# Patient Record
Sex: Male | Born: 1990 | Hispanic: Yes | Marital: Single | Smoking: Never smoker
Health system: Southern US, Community
[De-identification: ages and names within clinical notes are randomized; demographics above are authoritative.]

---

## 2016-03-04 ENCOUNTER — Inpatient Hospital Stay (HOSPITAL_COMMUNITY): Payer: Self-pay

## 2016-03-04 ENCOUNTER — Encounter (HOSPITAL_COMMUNITY)
Admission: EM | Disposition: A | Discharge status: Home Health | Payer: Self-pay | Source: Home / Self Care | Attending: Orthopedic Surgery

## 2016-03-04 ENCOUNTER — Emergency Department (HOSPITAL_COMMUNITY): Payer: Self-pay

## 2016-03-04 ENCOUNTER — Inpatient Hospital Stay (HOSPITAL_COMMUNITY): Payer: Self-pay | Admitting: Anesthesiology

## 2016-03-04 ENCOUNTER — Inpatient Hospital Stay (HOSPITAL_COMMUNITY)
Admission: EM | Admit: 2016-03-04 | Discharge: 2016-03-06 | DRG: 494 | Disposition: A | Discharge status: Home Health | Payer: Self-pay | Attending: Orthopedic Surgery | Admitting: Orthopedic Surgery

## 2016-03-04 ENCOUNTER — Encounter (HOSPITAL_COMMUNITY): Payer: Self-pay | Admitting: Emergency Medicine

## 2016-03-04 DIAGNOSIS — Z419 Encounter for procedure for purposes other than remedying health state, unspecified: Secondary | ICD-10-CM

## 2016-03-04 DIAGNOSIS — T1490XA Injury, unspecified, initial encounter: Secondary | ICD-10-CM

## 2016-03-04 DIAGNOSIS — D72829 Elevated white blood cell count, unspecified: Secondary | ICD-10-CM | POA: Diagnosis present

## 2016-03-04 DIAGNOSIS — S82209A Unspecified fracture of shaft of unspecified tibia, initial encounter for closed fracture: Secondary | ICD-10-CM | POA: Diagnosis present

## 2016-03-04 DIAGNOSIS — S82202A Unspecified fracture of shaft of left tibia, initial encounter for closed fracture: Principal | ICD-10-CM | POA: Diagnosis present

## 2016-03-04 HISTORY — PX: TIBIA IM NAIL INSERTION: SHX2516

## 2016-03-04 LAB — COMPREHENSIVE METABOLIC PANEL
ALK PHOS: 94 U/L (ref 38–126)
ALT: 43 U/L (ref 17–63)
AST: 32 U/L (ref 15–41)
Albumin: 3.8 g/dL (ref 3.5–5.0)
Anion gap: 8 (ref 5–15)
BUN: 7 mg/dL (ref 6–20)
CALCIUM: 8.9 mg/dL (ref 8.9–10.3)
CO2: 25 mmol/L (ref 22–32)
CREATININE: 0.86 mg/dL (ref 0.61–1.24)
Chloride: 107 mmol/L (ref 101–111)
Glucose, Bld: 108 mg/dL — ABNORMAL HIGH (ref 65–99)
Potassium: 4.2 mmol/L (ref 3.5–5.1)
Sodium: 140 mmol/L (ref 135–145)
Total Bilirubin: 0.6 mg/dL (ref 0.3–1.2)
Total Protein: 7.3 g/dL (ref 6.5–8.1)

## 2016-03-04 LAB — CBC
HCT: 47.8 % (ref 39.0–52.0)
Hemoglobin: 16.7 g/dL (ref 13.0–17.0)
MCH: 31 pg (ref 26.0–34.0)
MCHC: 34.9 g/dL (ref 30.0–36.0)
MCV: 88.7 fL (ref 78.0–100.0)
PLATELETS: 276 10*3/uL (ref 150–400)
RBC: 5.39 MIL/uL (ref 4.22–5.81)
RDW: 12.8 % (ref 11.5–15.5)
WBC: 16.2 10*3/uL — ABNORMAL HIGH (ref 4.0–10.5)

## 2016-03-04 LAB — SURGICAL PCR SCREEN
MRSA, PCR: NEGATIVE
Staphylococcus aureus: POSITIVE — AB

## 2016-03-04 LAB — PROTIME-INR
INR: 0.98 (ref 0.00–1.49)
Prothrombin Time: 13.2 seconds (ref 11.6–15.2)

## 2016-03-04 LAB — ETHANOL: ALCOHOL ETHYL (B): 7 mg/dL — AB (ref <5)

## 2016-03-04 SURGERY — INSERTION, INTRAMEDULLARY ROD, TIBIA
Anesthesia: Regional | Site: Leg Lower | Laterality: Left

## 2016-03-04 MED ORDER — FENTANYL CITRATE (PF) 250 MCG/5ML IJ SOLN
INTRAMUSCULAR | Status: AC
  Filled 2016-03-04: qty 5

## 2016-03-04 MED ORDER — MORPHINE SULFATE (PF) 2 MG/ML IV SOLN: 2.0000 mg | INTRAVENOUS | Status: DC | PRN

## 2016-03-04 MED ORDER — DEXTROSE-NACL 5-0.45 % IV SOLN: 100.0000 mL/h | INTRAVENOUS | Status: DC

## 2016-03-04 MED ORDER — ACETAMINOPHEN 650 MG RE SUPP: 650.0000 mg | Freq: Four times a day (QID) | RECTAL | Status: DC | PRN

## 2016-03-04 MED ORDER — LACTATED RINGERS IV SOLN
INTRAVENOUS | Status: DC | PRN
  Administered 2016-03-04 (×3): via INTRAVENOUS

## 2016-03-04 MED ORDER — ASPIRIN EC 325 MG PO TBEC: 325.0000 mg | DELAYED_RELEASE_TABLET | Freq: Every day | ORAL | Status: AC

## 2016-03-04 MED ORDER — CHLORHEXIDINE GLUCONATE 4 % EX LIQD: 60.0000 mL | Freq: Once | CUTANEOUS | Status: DC

## 2016-03-04 MED ORDER — FENTANYL CITRATE (PF) 100 MCG/2ML IJ SOLN
INTRAMUSCULAR | Status: DC | PRN
  Administered 2016-03-04 (×2): 50 ug via INTRAVENOUS
  Administered 2016-03-04 (×2): 25 ug via INTRAVENOUS
  Administered 2016-03-04 (×2): 50 ug via INTRAVENOUS

## 2016-03-04 MED ORDER — METHOCARBAMOL 500 MG PO TABS
500.0000 mg | ORAL_TABLET | Freq: Four times a day (QID) | ORAL | Status: DC | PRN
  Administered 2016-03-04 – 2016-03-06 (×3): 500 mg via ORAL
  Filled 2016-03-04 (×3): qty 1

## 2016-03-04 MED ORDER — METOCLOPRAMIDE HCL 5 MG PO TABS: 5.0000 mg | ORAL_TABLET | Freq: Three times a day (TID) | ORAL | Status: DC | PRN

## 2016-03-04 MED ORDER — ACETAMINOPHEN 500 MG PO TABS
1000.0000 mg | ORAL_TABLET | Freq: Once | ORAL | Status: AC
  Administered 2016-03-04: 1000 mg via ORAL

## 2016-03-04 MED ORDER — OXYCODONE-ACETAMINOPHEN 5-325 MG PO TABS: 1.0000 | ORAL_TABLET | ORAL | Status: AC | PRN

## 2016-03-04 MED ORDER — METHOCARBAMOL 1000 MG/10ML IJ SOLN
500.0000 mg | Freq: Four times a day (QID) | INTRAVENOUS | Status: DC | PRN
  Filled 2016-03-04: qty 5

## 2016-03-04 MED ORDER — MIDAZOLAM HCL 2 MG/2ML IJ SOLN
INTRAMUSCULAR | Status: AC
  Filled 2016-03-04: qty 2

## 2016-03-04 MED ORDER — ACETAMINOPHEN 325 MG PO TABS: 650.0000 mg | ORAL_TABLET | Freq: Four times a day (QID) | ORAL | Status: DC | PRN

## 2016-03-04 MED ORDER — DOCUSATE SODIUM 100 MG PO CAPS
100.0000 mg | ORAL_CAPSULE | Freq: Two times a day (BID) | ORAL | Status: DC
  Administered 2016-03-04 – 2016-03-06 (×4): 100 mg via ORAL
  Filled 2016-03-04 (×4): qty 1

## 2016-03-04 MED ORDER — KETOROLAC TROMETHAMINE 15 MG/ML IJ SOLN
15.0000 mg | Freq: Four times a day (QID) | INTRAMUSCULAR | Status: AC
  Administered 2016-03-04 – 2016-03-05 (×4): 15 mg via INTRAVENOUS
  Filled 2016-03-04 (×4): qty 1

## 2016-03-04 MED ORDER — ACETAMINOPHEN 160 MG/5ML PO SOLN: 325.0000 mg | ORAL | Status: DC | PRN

## 2016-03-04 MED ORDER — ONDANSETRON HCL 4 MG/2ML IJ SOLN
4.0000 mg | Freq: Once | INTRAMUSCULAR | Status: AC
  Administered 2016-03-04: 4 mg via INTRAVENOUS
  Filled 2016-03-04: qty 2

## 2016-03-04 MED ORDER — POVIDONE-IODINE 10 % EX SWAB: 2.0000 "application " | Freq: Once | CUTANEOUS | Status: DC

## 2016-03-04 MED ORDER — ACETAMINOPHEN 325 MG PO TABS
650.0000 mg | ORAL_TABLET | Freq: Four times a day (QID) | ORAL | Status: DC | PRN
  Filled 2016-03-04: qty 2

## 2016-03-04 MED ORDER — LACTATED RINGERS IV SOLN
INTRAVENOUS | Status: DC
  Administered 2016-03-04: 21:00:00 via INTRAVENOUS

## 2016-03-04 MED ORDER — PHENYLEPHRINE HCL 10 MG/ML IJ SOLN
10.0000 mg | INTRAVENOUS | Status: DC | PRN
  Administered 2016-03-04: 10 ug/min via INTRAVENOUS

## 2016-03-04 MED ORDER — ASPIRIN 325 MG PO TABS
325.0000 mg | ORAL_TABLET | Freq: Every day | ORAL | Status: DC
  Administered 2016-03-04 – 2016-03-06 (×3): 325 mg via ORAL
  Filled 2016-03-04 (×3): qty 1

## 2016-03-04 MED ORDER — DEXAMETHASONE SODIUM PHOSPHATE 10 MG/ML IJ SOLN
INTRAMUSCULAR | Status: DC | PRN
  Administered 2016-03-04: 10 mg via INTRAVENOUS

## 2016-03-04 MED ORDER — PHENYLEPHRINE 40 MCG/ML (10ML) SYRINGE FOR IV PUSH (FOR BLOOD PRESSURE SUPPORT)
PREFILLED_SYRINGE | INTRAVENOUS | Status: AC
  Filled 2016-03-04: qty 10

## 2016-03-04 MED ORDER — IOPAMIDOL (ISOVUE-300) INJECTION 61%
INTRAVENOUS | Status: AC
  Administered 2016-03-04: 100 mL
  Filled 2016-03-04: qty 100

## 2016-03-04 MED ORDER — OXYCODONE HCL 5 MG PO TABS: 5.0000 mg | ORAL_TABLET | ORAL | Status: DC | PRN

## 2016-03-04 MED ORDER — HYDROMORPHONE HCL 1 MG/ML IJ SOLN
INTRAMUSCULAR | Status: AC
  Administered 2016-03-04: 0.5 mg via INTRAVENOUS
  Filled 2016-03-04: qty 1

## 2016-03-04 MED ORDER — ACETAMINOPHEN 500 MG PO TABS
ORAL_TABLET | ORAL | Status: AC
  Filled 2016-03-04: qty 1

## 2016-03-04 MED ORDER — OMEPRAZOLE 20 MG PO CPDR: 20.0000 mg | DELAYED_RELEASE_CAPSULE | Freq: Every day | ORAL | Status: AC

## 2016-03-04 MED ORDER — DEXTROSE 5 % IV SOLN
3.0000 g | INTRAVENOUS | Status: AC
  Administered 2016-03-04: 3 g via INTRAVENOUS
  Filled 2016-03-04: qty 3000

## 2016-03-04 MED ORDER — OXYCODONE HCL 5 MG/5ML PO SOLN: 5.0000 mg | Freq: Once | ORAL | Status: DC | PRN

## 2016-03-04 MED ORDER — PROPOFOL 10 MG/ML IV BOLUS
INTRAVENOUS | Status: DC | PRN
  Administered 2016-03-04: 200 mg via INTRAVENOUS

## 2016-03-04 MED ORDER — ONDANSETRON HCL 4 MG/2ML IJ SOLN
INTRAMUSCULAR | Status: AC
  Filled 2016-03-04: qty 6

## 2016-03-04 MED ORDER — CEFAZOLIN IN D5W 1 GM/50ML IV SOLN
1.0000 g | Freq: Four times a day (QID) | INTRAVENOUS | Status: AC
  Administered 2016-03-04 – 2016-03-05 (×3): 1 g via INTRAVENOUS
  Filled 2016-03-04 (×4): qty 50

## 2016-03-04 MED ORDER — BUPIVACAINE-EPINEPHRINE (PF) 0.5% -1:200000 IJ SOLN
INTRAMUSCULAR | Status: DC | PRN
  Administered 2016-03-04: 30 mL via PERINEURAL

## 2016-03-04 MED ORDER — HYDROMORPHONE HCL 1 MG/ML IJ SOLN
1.0000 mg | Freq: Once | INTRAMUSCULAR | Status: AC
  Administered 2016-03-04: 1 mg via INTRAVENOUS
  Filled 2016-03-04: qty 1

## 2016-03-04 MED ORDER — ROCURONIUM BROMIDE 50 MG/5ML IV SOLN
INTRAVENOUS | Status: AC
  Filled 2016-03-04: qty 2

## 2016-03-04 MED ORDER — METOCLOPRAMIDE HCL 5 MG/ML IJ SOLN: 5.0000 mg | Freq: Three times a day (TID) | INTRAMUSCULAR | Status: DC | PRN

## 2016-03-04 MED ORDER — ONDANSETRON HCL 4 MG PO TABS: 4.0000 mg | ORAL_TABLET | Freq: Every day | ORAL | Status: AC | PRN

## 2016-03-04 MED ORDER — ONDANSETRON HCL 4 MG/2ML IJ SOLN: 4.0000 mg | Freq: Four times a day (QID) | INTRAMUSCULAR | Status: DC | PRN

## 2016-03-04 MED ORDER — ONDANSETRON HCL 4 MG/2ML IJ SOLN
INTRAMUSCULAR | Status: DC | PRN
  Administered 2016-03-04: 4 mg via INTRAVENOUS

## 2016-03-04 MED ORDER — HYDROMORPHONE HCL 1 MG/ML IJ SOLN
0.2500 mg | INTRAMUSCULAR | Status: DC | PRN
Start: 2016-03-04 — End: 2016-03-04
  Administered 2016-03-04: 0.5 mg via INTRAVENOUS

## 2016-03-04 MED ORDER — ACETAMINOPHEN 325 MG PO TABS: 325.0000 mg | ORAL_TABLET | ORAL | Status: DC | PRN

## 2016-03-04 MED ORDER — PHENYLEPHRINE HCL 10 MG/ML IJ SOLN
INTRAMUSCULAR | Status: DC | PRN
  Administered 2016-03-04 (×6): 80 ug via INTRAVENOUS

## 2016-03-04 MED ORDER — CELECOXIB 200 MG PO CAPS
200.0000 mg | ORAL_CAPSULE | Freq: Two times a day (BID) | ORAL | Status: DC
  Administered 2016-03-04 – 2016-03-06 (×4): 200 mg via ORAL
  Filled 2016-03-04 (×5): qty 1

## 2016-03-04 MED ORDER — FENTANYL CITRATE (PF) 100 MCG/2ML IJ SOLN
100.0000 ug | Freq: Once | INTRAMUSCULAR | Status: AC
  Administered 2016-03-04: 100 ug via INTRAVENOUS
  Filled 2016-03-04: qty 2

## 2016-03-04 MED ORDER — LACTATED RINGERS IV SOLN
INTRAVENOUS | Status: DC
  Administered 2016-03-04: 14:00:00 via INTRAVENOUS

## 2016-03-04 MED ORDER — DIPHENHYDRAMINE HCL 12.5 MG/5ML PO ELIX: 12.5000 mg | ORAL_SOLUTION | ORAL | Status: DC | PRN

## 2016-03-04 MED ORDER — 0.9 % SODIUM CHLORIDE (POUR BTL) OPTIME
TOPICAL | Status: DC | PRN
  Administered 2016-03-04: 1000 mL

## 2016-03-04 MED ORDER — DEXTROSE-NACL 5-0.45 % IV SOLN: INTRAVENOUS | Status: DC

## 2016-03-04 MED ORDER — ONDANSETRON HCL 4 MG PO TABS: 4.0000 mg | ORAL_TABLET | Freq: Four times a day (QID) | ORAL | Status: DC | PRN

## 2016-03-04 MED ORDER — SUCCINYLCHOLINE CHLORIDE 20 MG/ML IJ SOLN
INTRAMUSCULAR | Status: DC | PRN
  Administered 2016-03-04: 60 mg via INTRAVENOUS

## 2016-03-04 MED ORDER — OXYCODONE HCL 5 MG PO TABS
5.0000 mg | ORAL_TABLET | ORAL | Status: DC | PRN
  Administered 2016-03-05: 10 mg via ORAL
  Administered 2016-03-05 (×2): 5 mg via ORAL
  Administered 2016-03-06: 10 mg via ORAL
  Filled 2016-03-04 (×2): qty 2
  Filled 2016-03-04 (×2): qty 1

## 2016-03-04 MED ORDER — MIDAZOLAM HCL 5 MG/5ML IJ SOLN
INTRAMUSCULAR | Status: DC | PRN
  Administered 2016-03-04: 2 mg via INTRAVENOUS

## 2016-03-04 MED ORDER — PROPOFOL 10 MG/ML IV BOLUS
INTRAVENOUS | Status: AC
  Filled 2016-03-04: qty 20

## 2016-03-04 MED ORDER — DOCUSATE SODIUM 100 MG PO CAPS: 100.0000 mg | ORAL_CAPSULE | Freq: Two times a day (BID) | ORAL | Status: AC

## 2016-03-04 MED ORDER — OXYCODONE HCL 5 MG PO TABS: 5.0000 mg | ORAL_TABLET | Freq: Once | ORAL | Status: DC | PRN

## 2016-03-04 MED ORDER — FENTANYL CITRATE (PF) 100 MCG/2ML IJ SOLN
INTRAMUSCULAR | Status: DC
Start: 2016-03-04 — End: 2016-03-04
  Filled 2016-03-04: qty 2

## 2016-03-04 MED ORDER — ACETAMINOPHEN 650 MG RE SUPP
650.0000 mg | Freq: Four times a day (QID) | RECTAL | Status: DC | PRN
  Filled 2016-03-04: qty 1

## 2016-03-04 SURGICAL SUPPLY — 79 items
BANDAGE ACE 4X5 VEL STRL LF (GAUZE/BANDAGES/DRESSINGS) ×3 IMPLANT
BANDAGE ELASTIC 4 VELCRO ST LF (GAUZE/BANDAGES/DRESSINGS) ×3 IMPLANT
BANDAGE ELASTIC 6 VELCRO ST LF (GAUZE/BANDAGES/DRESSINGS) ×3 IMPLANT
BENZOIN TINCTURE PRP APPL 2/3 (GAUZE/BANDAGES/DRESSINGS) ×3 IMPLANT
BIT DRILL AO GAMMA 4.2X130 (BIT) ×3 IMPLANT
BIT DRILL AO GAMMA 4.2X340 (BIT) ×3 IMPLANT
BLADE SURG 10 STRL SS (BLADE) ×3 IMPLANT
BLADE SURG 15 STRL LF DISP TIS (BLADE) ×1 IMPLANT
BLADE SURG 15 STRL SS (BLADE) ×2
BNDG COHESIVE 4X5 TAN STRL (GAUZE/BANDAGES/DRESSINGS) ×3 IMPLANT
BNDG GAUZE ELAST 4 BULKY (GAUZE/BANDAGES/DRESSINGS) ×3 IMPLANT
CHLORAPREP W/TINT 26ML (MISCELLANEOUS) ×3 IMPLANT
CLOSURE STERI-STRIP 1/2X4 (GAUZE/BANDAGES/DRESSINGS) ×1
CLOSURE WOUND 1/2 X4 (GAUZE/BANDAGES/DRESSINGS) ×1
CLSR STERI-STRIP ANTIMIC 1/2X4 (GAUZE/BANDAGES/DRESSINGS) ×2 IMPLANT
COVER MAYO STAND STRL (DRAPES) ×3 IMPLANT
COVER SURGICAL LIGHT HANDLE (MISCELLANEOUS) ×6 IMPLANT
CUFF TOURNIQUET SINGLE 34IN LL (TOURNIQUET CUFF) IMPLANT
CUFF TOURNIQUET SINGLE 44IN (TOURNIQUET CUFF) IMPLANT
DRAPE C-ARM 42X72 X-RAY (DRAPES) ×3 IMPLANT
DRAPE C-ARMOR (DRAPES) IMPLANT
DRAPE IMP U-DRAPE 54X76 (DRAPES) ×3 IMPLANT
DRAPE PROXIMA HALF (DRAPES) ×3 IMPLANT
DRESSING OPSITE X SMALL 2X3 (GAUZE/BANDAGES/DRESSINGS) ×3 IMPLANT
DRSG ADAPTIC 3X8 NADH LF (GAUZE/BANDAGES/DRESSINGS) ×3 IMPLANT
DRSG PAD ABDOMINAL 8X10 ST (GAUZE/BANDAGES/DRESSINGS) ×3 IMPLANT
DRSG TEGADERM 4X4.75 (GAUZE/BANDAGES/DRESSINGS) ×6 IMPLANT
ELECT CAUTERY BLADE 6.4 (BLADE) ×3 IMPLANT
ELECT REM PT RETURN 9FT ADLT (ELECTROSURGICAL) ×3
ELECTRODE REM PT RTRN 9FT ADLT (ELECTROSURGICAL) ×1 IMPLANT
END CAP TIBIAL T2 11.5X10 (Cap) ×3 IMPLANT
GAUZE SPONGE 4X4 12PLY STRL (GAUZE/BANDAGES/DRESSINGS) ×3 IMPLANT
GLOVE BIO SURGEON STRL SZ7 (GLOVE) ×3 IMPLANT
GLOVE BIO SURGEON STRL SZ7.5 (GLOVE) ×6 IMPLANT
GLOVE BIO SURGEON STRL SZ8.5 (GLOVE) IMPLANT
GLOVE BIOGEL PI IND STRL 6.5 (GLOVE) ×1 IMPLANT
GLOVE BIOGEL PI IND STRL 7.0 (GLOVE) ×1 IMPLANT
GLOVE BIOGEL PI IND STRL 8 (GLOVE) ×2 IMPLANT
GLOVE BIOGEL PI INDICATOR 6.5 (GLOVE) ×2
GLOVE BIOGEL PI INDICATOR 7.0 (GLOVE) ×2
GLOVE BIOGEL PI INDICATOR 8 (GLOVE) ×4
GLOVE ECLIPSE 6.5 STRL STRAW (GLOVE) ×3 IMPLANT
GOWN STRL REUS W/ TWL LRG LVL3 (GOWN DISPOSABLE) ×2 IMPLANT
GOWN STRL REUS W/TWL LRG LVL3 (GOWN DISPOSABLE) ×4
GUIDEWIRE GAMMA (WIRE) ×3 IMPLANT
GUIDEWIRE GAMMA 800 (WIRE) ×3 IMPLANT
KIT BASIN OR (CUSTOM PROCEDURE TRAY) ×3 IMPLANT
KIT ROOM TURNOVER OR (KITS) ×3 IMPLANT
MANIFOLD NEPTUNE II (INSTRUMENTS) ×3 IMPLANT
NAIL TIBIAL STND 10X300MM (Nail) ×3 IMPLANT
NAIL TIBIALSTD 10X300MM (Nail) ×1 IMPLANT
NS IRRIG 1000ML POUR BTL (IV SOLUTION) ×3 IMPLANT
PACK ORTHO EXTREMITY (CUSTOM PROCEDURE TRAY) ×3 IMPLANT
PACK UNIVERSAL I (CUSTOM PROCEDURE TRAY) ×3 IMPLANT
PAD ARMBOARD 7.5X6 YLW CONV (MISCELLANEOUS) ×6 IMPLANT
PAD CAST 4YDX4 CTTN HI CHSV (CAST SUPPLIES) ×1 IMPLANT
PADDING CAST COTTON 4X4 STRL (CAST SUPPLIES) ×2
REAMER SHAFT BIXCUT (INSTRUMENTS) ×3 IMPLANT
SCREW LOCKING FULL THREAD 5X52 (Screw) ×3 IMPLANT
SCREW LOCKING T2 F/T  5MMX40MM (Screw) ×2 IMPLANT
SCREW LOCKING T2 F/T 5MMX40MM (Screw) ×1 IMPLANT
SCREW LOCKING THREADED 5X47.5 (Screw) ×3 IMPLANT
SPONGE LAP 18X18 X RAY DECT (DISPOSABLE) ×3 IMPLANT
STAPLER VISISTAT 35W (STAPLE) IMPLANT
STRIP CLOSURE SKIN 1/2X4 (GAUZE/BANDAGES/DRESSINGS) ×2 IMPLANT
SUT MNCRL AB 4-0 PS2 18 (SUTURE) IMPLANT
SUT MON AB 2-0 CT1 27 (SUTURE) ×3 IMPLANT
SUT MON AB 2-0 CT1 36 (SUTURE) ×3 IMPLANT
SUT MON AB 5-0 PS2 18 (SUTURE) ×3 IMPLANT
SUT VIC AB 0 CT1 27 (SUTURE) ×2
SUT VIC AB 0 CT1 27XBRD ANBCTR (SUTURE) ×1 IMPLANT
SYR BULB IRRIGATION 50ML (SYRINGE) ×3 IMPLANT
TOWEL OR 17X24 6PK STRL BLUE (TOWEL DISPOSABLE) ×3 IMPLANT
TOWEL OR 17X26 10 PK STRL BLUE (TOWEL DISPOSABLE) ×3 IMPLANT
TOWEL OR NON WOVEN STRL DISP B (DISPOSABLE) ×3 IMPLANT
TUBE CONNECTING 12'X1/4 (SUCTIONS) ×1
TUBE CONNECTING 12X1/4 (SUCTIONS) ×2 IMPLANT
WATER STERILE IRR 1000ML POUR (IV SOLUTION) ×3 IMPLANT
YANKAUER SUCT BULB TIP NO VENT (SUCTIONS) ×3 IMPLANT

## 2016-03-04 NOTE — Op Note (Signed)
03/04/2016  4:06 PM  PATIENT:  Brandon May    PRE-OPERATIVE DIAGNOSIS:  Tibia fracture  POST-OPERATIVE DIAGNOSIS:  Same  PROCEDURE:  INTRAMEDULLARY (IM) NAIL TIBIAL  SURGEON:  MURPHY, Jewel BaizeIMOTHY D, MD  ASSISTANT: Aquilla HackerHenry Martensen, PA-C, She was present and scrubbed throughout the case, critical for completion in a timely fashion, and for retraction, instrumentation, and closure.   ANESTHESIA:   General  PREOPERATIVE INDICATIONS:  Brandon May is a  25 y.o. male with a diagnosis of Tibia fracture who failed conservative measures and elected for surgical management.    The risks benefits and alternatives were discussed with the patient preoperatively including but not limited to the risks of infection, bleeding, nerve injury, cardiopulmonary complications, the need for revision surgery, among others, and the patient was willing to proceed.  OPERATIVE IMPLANTS: Stryker T2 Tibial nail 330 x 10   BLOOD LOSS: minimal  COMPLICATIONS: none  TOURNIQUET TIME: none  OPERATIVE PROCEDURE:  Patient was identified in the preoperative holding area and site was marked by me He was transported to the operating theater and placed on the table in supine position taking care to pad all bony prominences. After a preincinduction time out anesthesia was induced. The left lower extremity was prepped and draped in normal sterile fashion and a pre-incision timeout was performed. Brandon May received ancef for preoperative antibiotics.   The leg was placed over the radiolucent triangle. I then made a 4 cm incision over the patella tendon. I dissected down and incised the patella tendon taking care to not penetrate into the joint itself.   I placed a guidewire under fluoroscopic guidance just medial to lateral tibial spine. I was happy with this placement on all views. I used the entry reamer to create a path into the proximal tibia staying out of the joint itself.  I then passed the ball tip guidewire down the  tibia and across the fracture site. I held appropriate reduction confirmed on multiple views of fluoroscopy and sequentially reamed up to an appropriate size with appropriate chatter. I selected the above-sized nail and passed it down the ball-tipped guidewire and seated it completely to a was sunk into the proximal tibia.  I then used the outrigger placed a static transverse and 2 oblique proximal locking screws. As happy with her length and placement on multiple oblique fluoroscopic views.  Next I confirmed appropriate rotation of the tibia with fracture tease and orientation of the patella and toes. I then used a perfect circles technique to place a distal static interlock screw.  The wounds were then all thoroughly irrigated and closed in layers. Sterile dressing was applied and he was taken to the PACU in stable condition.  POST OPERATIVE PLAN: WBAT, DVT prophylaxis: Early ambulation, SCD's, ASA 325  This note was generated using a template and dragon dictation system. In light of that, I have reviewed the note and all aspects of it are applicable to this case. Any dictation errors are due to the computerized dictation system.
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
## 2016-03-04 NOTE — Transfer of Care (Signed)
Immediate Anesthesia Transfer of Care Note  Patient: Brandon May  Procedure(s) Performed: Procedure(s): INTRAMEDULLARY (IM) NAIL TIBIAL (Left)  Patient Location: PACU  Anesthesia Type:GA combined with regional for post-op pain  Level of Consciousness: awake  Airway & Oxygen Therapy: Patient Spontanous Breathing and Patient connected to nasal cannula oxygen  Post-op Assessment: Report given to RN, Post -op Vital signs reviewed and stable and Patient moving all extremities X 4  Post vital signs: Reviewed and stable  Last Vitals:  Filed Vitals:   03/04/16 1315 03/04/16 1423  BP: 137/78   Pulse: 107 103  Temp:    Resp: 21 21    Last Pain:  Filed Vitals:   03/04/16 1424  PainSc: 9          Complications: No apparent anesthesia complications

## 2016-03-04 NOTE — Discharge Instructions (Signed)
Take Aspirin 325 mg and omeprazole 20 mg daily.    Diet: As you were doing prior to hospitalization   Shower:  May shower but keep the wounds dry, use an occlusive plastic wrap, NO SOAKING IN TUB.  If the bandage gets wet, change with a clean dry gauze.  If you have a splint on, leave the splint in place and keep the splint dry with a plastic bag.  Dressing:  Keep dressings on and dry until follow up.  Activity:  Increase activity slowly as tolerated, but follow the weight bearing instructions below.  The rules on driving is that you can not be taking narcotics while you drive, and you must feel in control of the vehicle.    Weight Bearing:   As tolerated left leg.   To prevent constipation: you may use a stool softener such as -  Colace (over the counter) 100 mg by mouth twice a day  Drink plenty of fluids (prune juice may be helpful) and high fiber foods Miralax (over the counter) for constipation as needed.    Itching:  If you experience itching with your medications, try taking only a single pain pill, or even half a pain pill at a time.  You may take up to 10 pain pills per day, and you can also use benadryl over the counter for itching or also to help with sleep.   Precautions:  If you experience chest pain or shortness of breath - call 911 immediately for transfer to the hospital emergency department!!  If you develop a fever greater that 101 F, purulent drainage from wound, increased redness or drainage from wound, or calf pain -- Call the office at [phone]                                                 Follow- Up Appointment:  Please call for an [phone]
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
## 2016-03-04 NOTE — Anesthesia Preprocedure Evaluation (Addendum)
Anesthesia Evaluation  Patient identified by MRN, date of birth, ID band Patient awake    Reviewed: Allergy & Precautions, NPO status , Patient's Chart, lab work & pertinent test results  History of Anesthesia Complications Negative for: history of anesthetic complications  Airway Mallampati: II  TM Distance: >3 FB Neck ROM: Full    Dental  (+) Teeth Intact   Pulmonary neg pulmonary ROS,    breath sounds clear to auscultation       Cardiovascular negative cardio ROS   Rhythm:Regular     Neuro/Psych negative neurological ROS  negative psych ROS   GI/Hepatic negative GI ROS, Neg liver ROS,   Endo/Other  negative endocrine ROS  Renal/GU negative Renal ROS     Musculoskeletal Left tibial fracture   Abdominal   Peds  Hematology negative hematology ROS (+)   Anesthesia Other Findings   Reproductive/Obstetrics                             Anesthesia Physical Anesthesia Plan  ASA: II  Anesthesia Plan: General and Regional   Post-op Pain Management:    Induction: Intravenous  Airway Management Planned: Oral ETT  Additional Equipment: None  Intra-op Plan:   Post-operative Plan: Extubation in OR  Informed Consent: I have reviewed the patients History and Physical, chart, labs and discussed the procedure including the risks, benefits and alternatives for the proposed anesthesia with the patient or authorized representative who has indicated his/her understanding and acceptance.   Dental advisory given and Dental Advisory Given  Plan Discussed with: CRNA, Surgeon and Anesthesiologist  Anesthesia Plan Comments:       Anesthesia Quick Evaluation
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
## 2016-03-04 NOTE — Anesthesia Procedure Notes (Addendum)
Procedure Name: Intubation Date/Time: 03/04/2016 2:44 PM Performed by: Little IshikawaMERCER, KARI L Pre-anesthesia Checklist: Patient identified, Emergency Drugs available, Suction available, Patient being monitored and Timeout performed Patient Re-evaluated:Patient Re-evaluated prior to inductionOxygen Delivery Method: Circle system utilized Preoxygenation: Pre-oxygenation with 100% oxygen Intubation Type: IV induction Ventilation: Mask ventilation without difficulty Laryngoscope Size: Mac and 4 Grade View: Grade I Tube type: Oral Tube size: 7.5 mm Number of attempts: 1 Airway Equipment and Method: Stylet Placement Confirmation: ETT inserted through vocal cords under direct vision,  positive ETCO2 and breath sounds checked- equal and bilateral Secured at: 22 cm Tube secured with: Tape Dental Injury: Teeth and Oropharynx as per pre-operative assessment    Anesthesia Regional Block:  Popliteal block  Pre-Anesthetic Checklist: ,, timeout performed, Correct Patient, Correct Site, Correct Laterality, Correct Procedure, Correct Position, site marked, Risks and benefits discussed,  Surgical consent,  Pre-op evaluation,  At surgeon's request and post-op pain management  Laterality: Lower and Left  Prep: chloraprep       Needles:  Injection technique: Single-shot  Needle Type: Echogenic Stimulator Needle          Additional Needles:  Procedures: ultrasound guided (picture in chart) and nerve stimulator Popliteal block  Nerve Stimulator or Paresthesia:  Response: plantar, 0.5 mA,   Additional Responses:   Narrative:  Injection made incrementally with aspirations every 5 mL.  Performed by: Personally  Anesthesiologist: Avaleen Brownley  Additional Notes: H+P and labs reviewed, risks and benefits discussed with patient, procedure tolerated well without complications

## 2016-03-05 MED ORDER — MUPIROCIN 2 % EX OINT
1.0000 "application " | TOPICAL_OINTMENT | Freq: Two times a day (BID) | CUTANEOUS | Status: DC
  Administered 2016-03-05 – 2016-03-06 (×3): 1 via NASAL
  Filled 2016-03-05 (×2): qty 22

## 2016-03-05 MED ORDER — CHLORHEXIDINE GLUCONATE CLOTH 2 % EX PADS
6.0000 | MEDICATED_PAD | Freq: Every day | CUTANEOUS | Status: DC
  Administered 2016-03-05 – 2016-03-06 (×2): 6 via TOPICAL
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
## 2016-03-05 NOTE — Care Management (Signed)
Kathlene NovemberMike from [employer] called [phone] returned called . Kathlene NovemberMike stated Worker's Comp policy number is [id number] and it is through [employer] , Kathlene NovemberMike does not know the phone number to the [employer].  NCM called [employer] [phone], spoke with Jill AlexandersJustin and gave [employer] policy number. Jill AlexandersJustin stated that policy number is [employer] policy number in general . Jill AlexandersJustin attempted to look up patient in several different ways . The company has not filed a claim yet . Jill AlexandersJustin provided phone number for claim department [phone] open 24hr/day 7 days a week. Called Kathlene NovemberMike back and left information on his voice mail.  Ronny FlurryHeather Lakeesha Fontanilla RN BSN [phone]

## 2016-03-05 NOTE — Progress Notes (Signed)
Interpreter Graciela Namihira for patient °
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
## 2016-03-05 NOTE — Anesthesia Postprocedure Evaluation (Signed)
Anesthesia Post Note  Patient: Brandon May  Procedure(s) Performed: Procedure(s) (LRB): INTRAMEDULLARY (IM) NAIL TIBIAL (Left)  Patient location during evaluation: PACU Anesthesia Type: General and Regional Level of consciousness: awake Pain management: pain level controlled Vital Signs Assessment: post-procedure vital signs reviewed and stable Respiratory status: spontaneous breathing Cardiovascular status: stable Postop Assessment: no signs of nausea or vomiting Anesthetic complications: no    Last Vitals:  Filed Vitals:   03/04/16 2037 03/05/16 0205  BP: 137/85 121/67  Pulse: 90 83  Temp: 37.1 C 36.5 C  Resp: 18 18    Last Pain:  Filed Vitals:   03/05/16 0712  PainSc: 0-No pain                 Gaylen Venning
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
## 2016-03-06 ENCOUNTER — Encounter (HOSPITAL_COMMUNITY): Payer: Self-pay | Admitting: Orthopedic Surgery

## 2016-03-06 NOTE — Progress Notes (Signed)
Pt ready for discharge to home. Pt. Is alert and oriented. IV removed. Pt is hemodynamically stable. AVS reviewed with pt. Capable of re verbalizing medication regimen. Discharge plan appropriate and in place.
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
## 2016-03-06 NOTE — Progress Notes (Signed)
Patient's friend came without a car to take patient home- maybe by bus? But patient has home equipments so both patient and friend are now waiting for a third friend to come with a car. Patient is awaiting ride at this time. Charge Rn notified.
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
## 2016-04-05 ENCOUNTER — Encounter (HOSPITAL_COMMUNITY): Payer: Self-pay | Admitting: Orthopedic Surgery

## 2016-04-05 NOTE — Addendum Note (Signed)
Addendum  created 04/05/16 1156 by Val Eaglehristopher Jodeen Mclin, MD   Anesthesia Event edited, Anesthesia Staff edited

## 2017-05-21 IMAGING — CR DG TIBIA/FIBULA PORT 2V*L*
4 series · 4 of 4 positions shown · non-contrast
Comparison: Intraoperative radiograph from the same date.

CLINICAL DATA: Status post internal fixation of left tibial
fracture.

EXAM:
PORTABLE LEFT TIBIA AND FIBULA - 2 VIEW

[AP (1 of 3)]
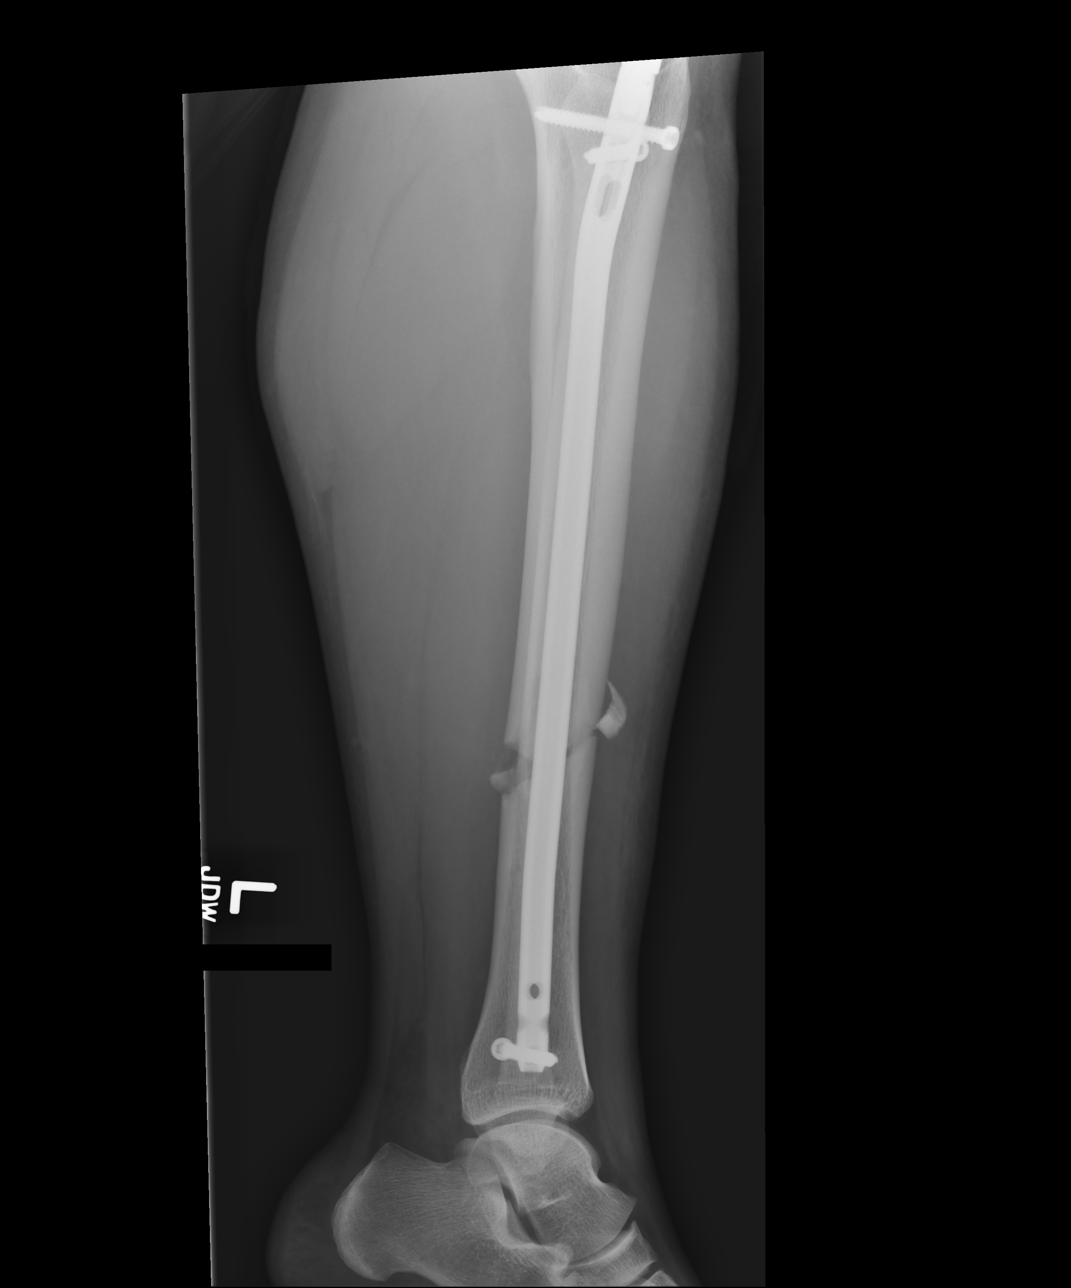

[lateral]
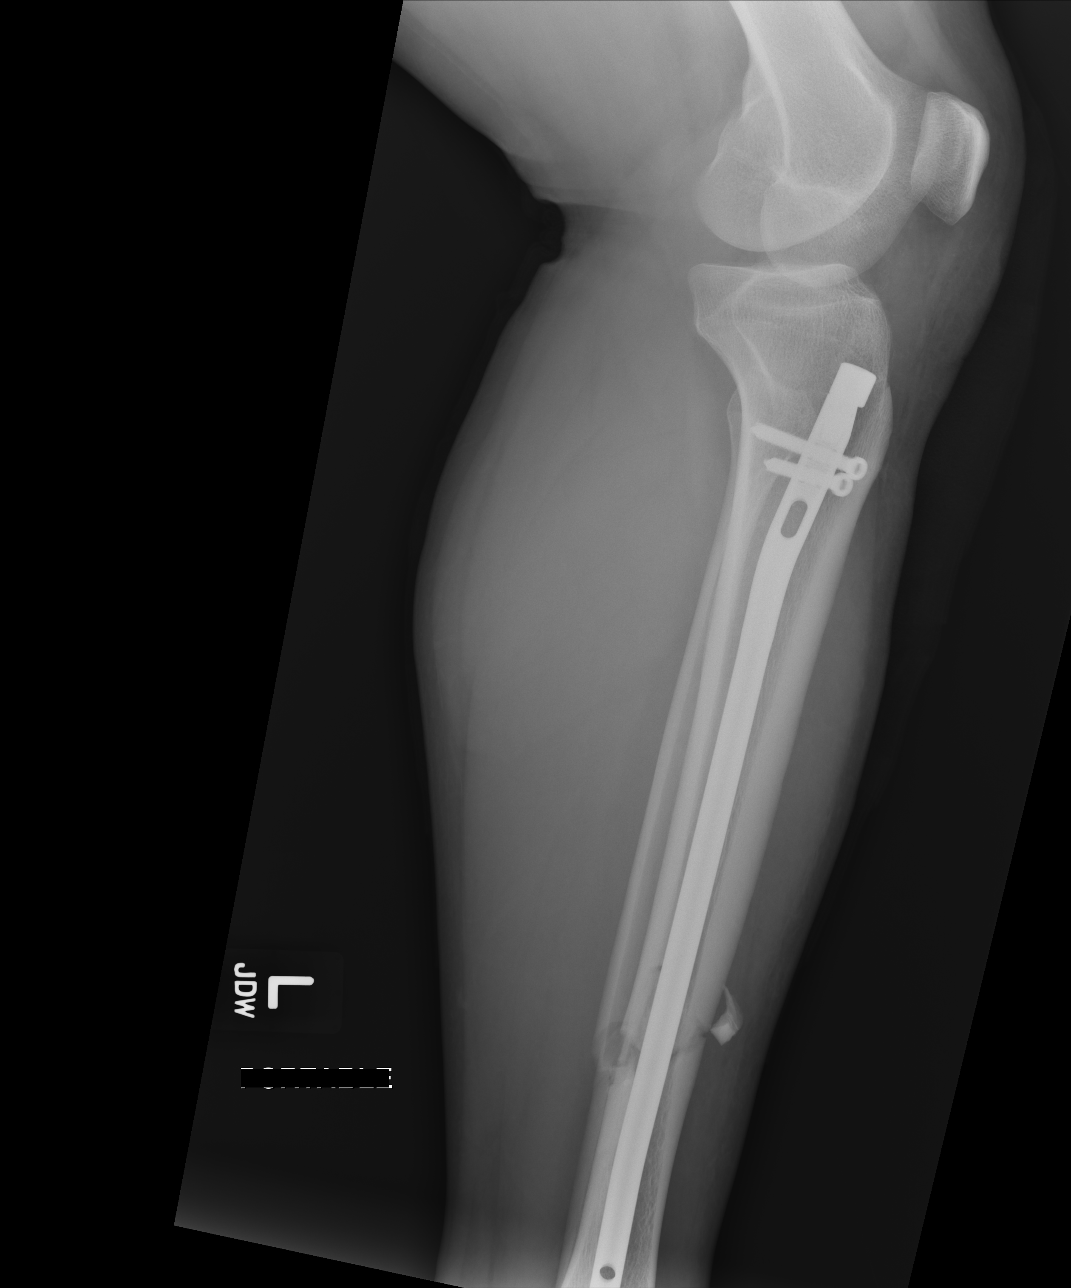

[AP (2 of 3)]
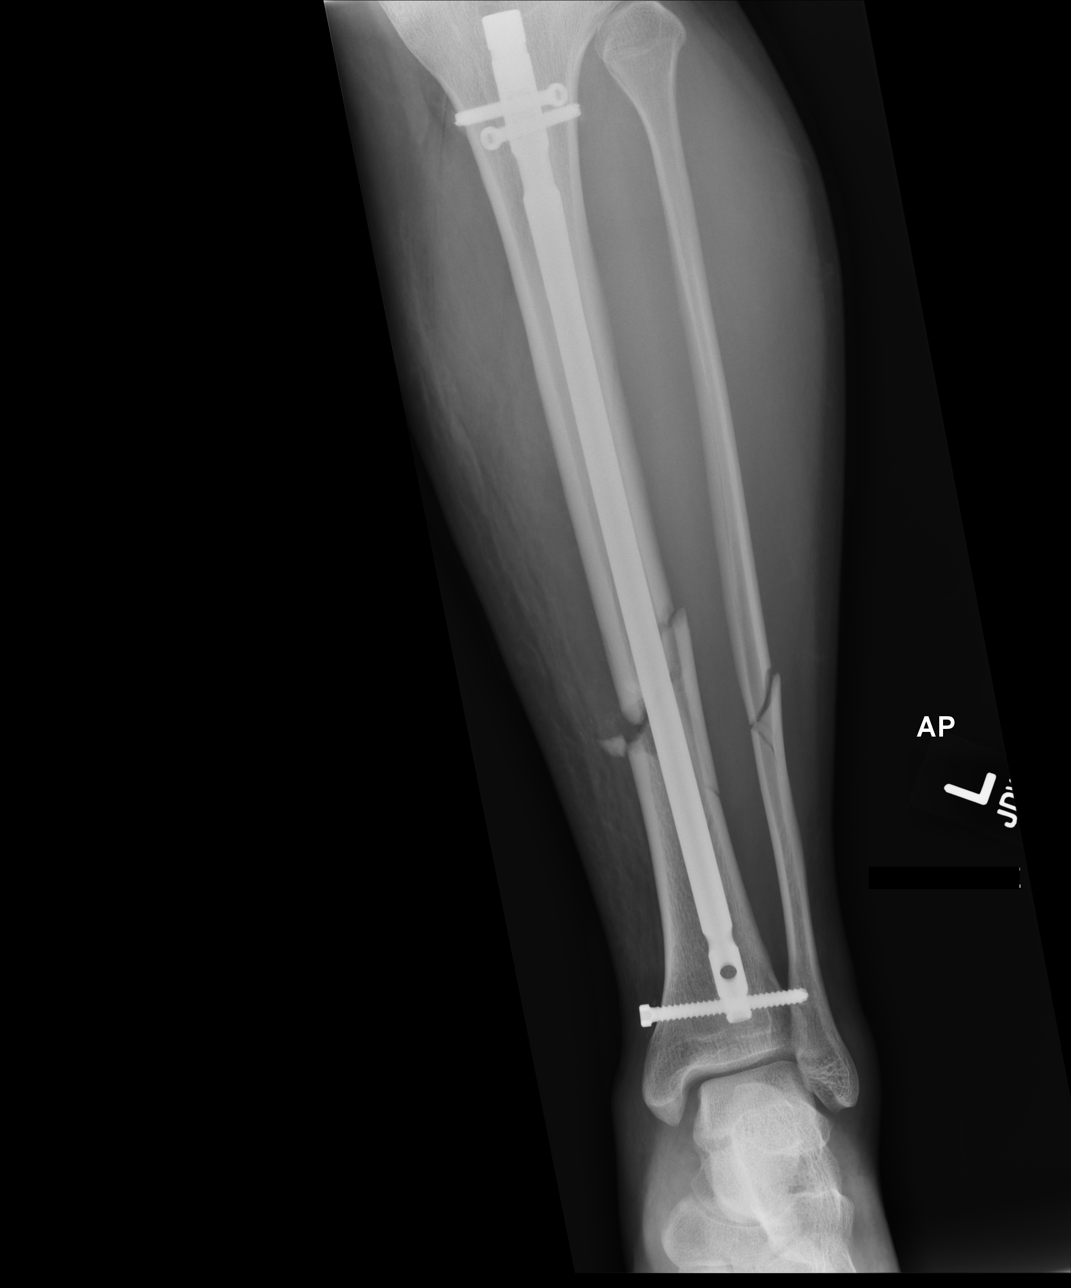

[AP (3 of 3)]
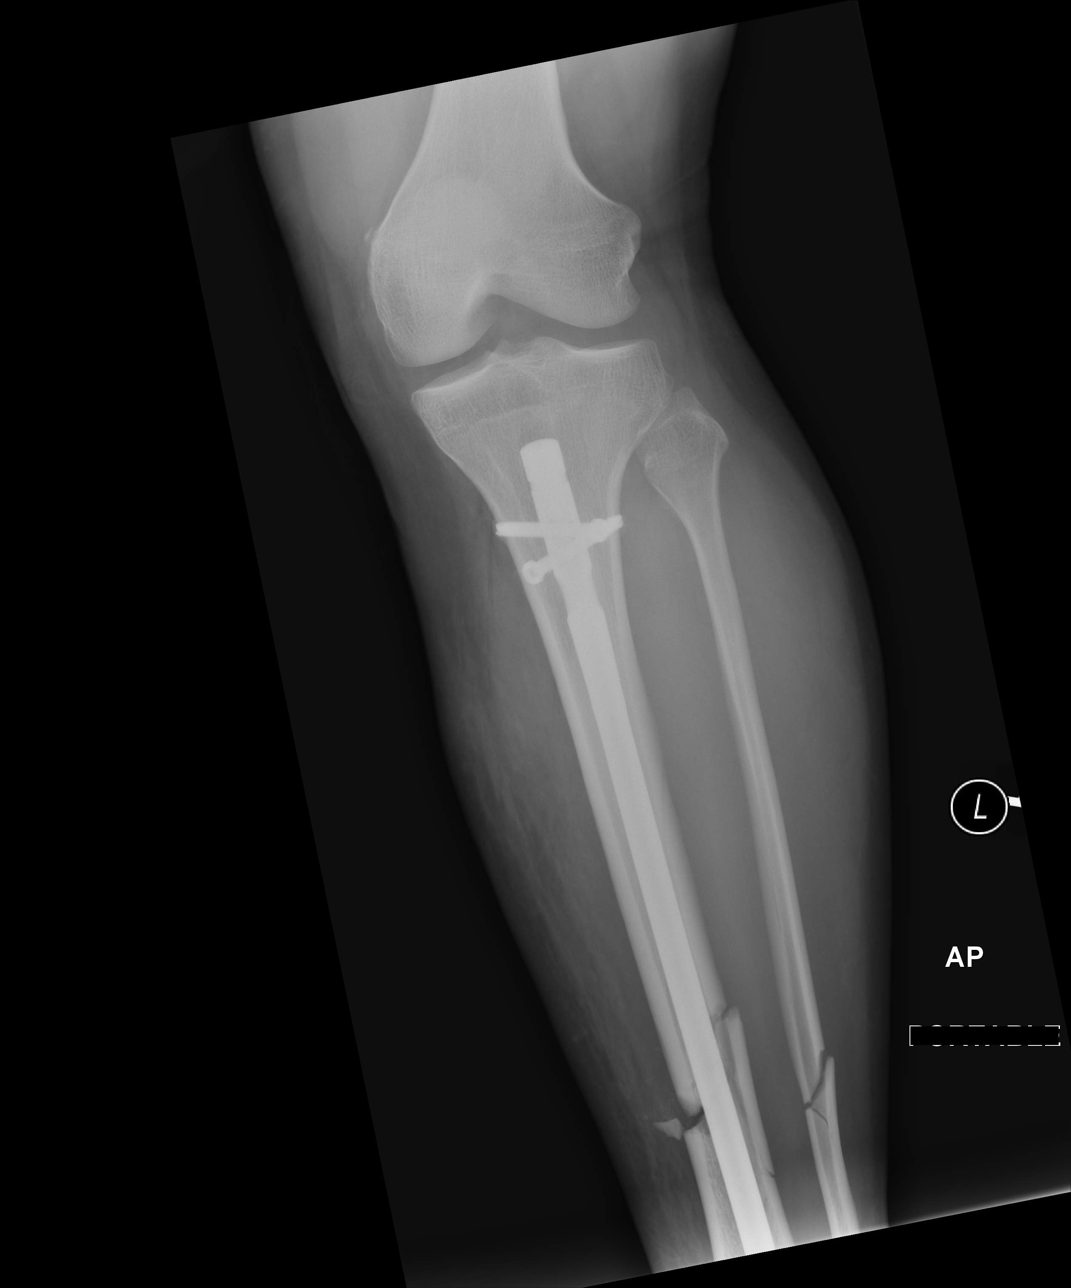

[4 of 4 positions shown; findings below may reference images not displayed]

FINDINGS: There has been interval intra medullary rod and screw fixation of
mid to distal shaft left tibial fracture with near anatomic
alignment of the main fracture fragments. A slightly distracted
butterfly fragment is noted laterally. Additionally, there is a
small osseous fragment off of the anterior medial cortex of the
tibia which is significantly displaced anteriorly. There is no
evidence of hardware complications. Comminuted fracture of the
distal [DATE] of the fibula is also noted. There are is soft tissue
edema and small amount of emphysema.
IMPRESSION: Post intramedullary rod and screw fixation of severely comminuted
distal [DATE] tibial shaft fracture with near anatomic alignment post
fixation.

Associated comminuted fibular fracture also with improved alignment.

Soft tissue edema and small amount of soft tissue emphysema,
possibly postprocedural.
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
